# Patient Record
Sex: Male | Born: 2000 | Race: White | Hispanic: No | Marital: Single | Smoking: Never smoker
Health system: Southern US, Community
[De-identification: ages and names within clinical notes are randomized; demographics above are authoritative.]

## PROBLEM LIST (undated history)

## (undated) DIAGNOSIS — Z86718 Personal history of other venous thrombosis and embolism: Secondary | ICD-10-CM

## (undated) DIAGNOSIS — F909 Attention-deficit hyperactivity disorder, unspecified type: Secondary | ICD-10-CM

## (undated) DIAGNOSIS — F840 Autistic disorder: Secondary | ICD-10-CM

## (undated) HISTORY — DX: Personal history of other venous thrombosis and embolism: Z86.718

---
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
## 2022-01-05 IMAGING — CT CT HEAD W/O CM
3 series · 16 of 47 positions shown, 19 images · non-contrast
Comparison: None.

CLINICAL DATA: Laceration to right eyebrow head trauma

EXAM:
CT HEAD WITHOUT CONTRAST
TECHNIQUE: Contiguous axial images were obtained from the base of the skull
through the vertex without intravenous contrast.

[Series 2: head w o · axial · 0.43mm/px · z∈[-647,-517]mm · 10 of 32 slices shown, 13 images]
[im 3/32  brain]
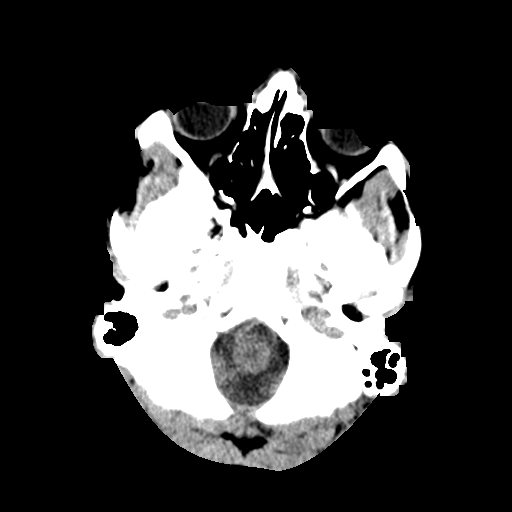
[im 3/32  bone]
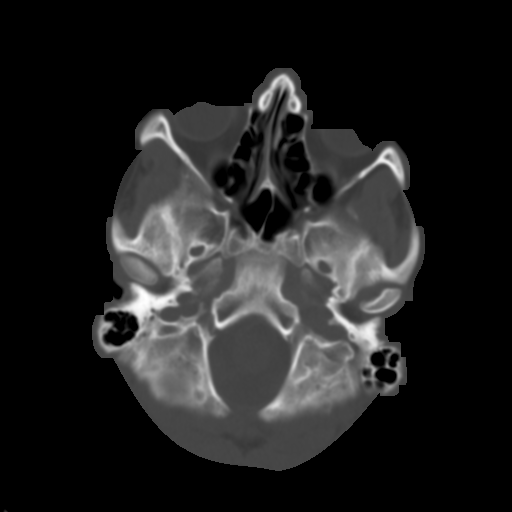
[im 6/32  brain]
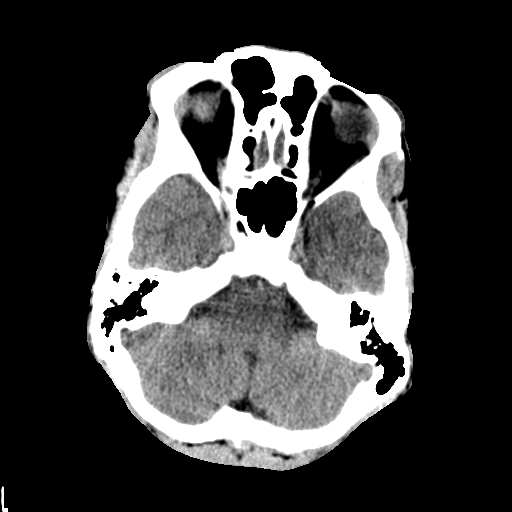
[im 9/32  brain]
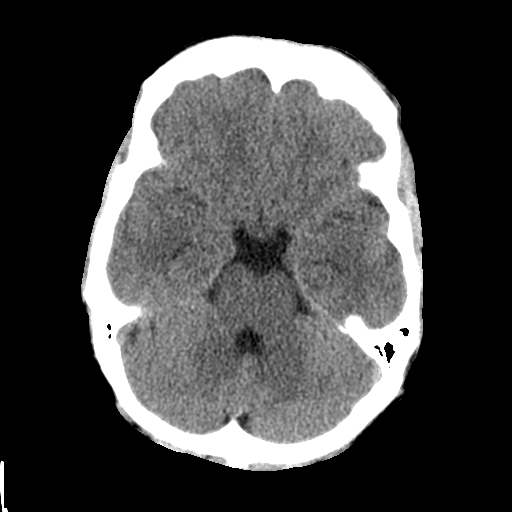
[im 11/32  brain]
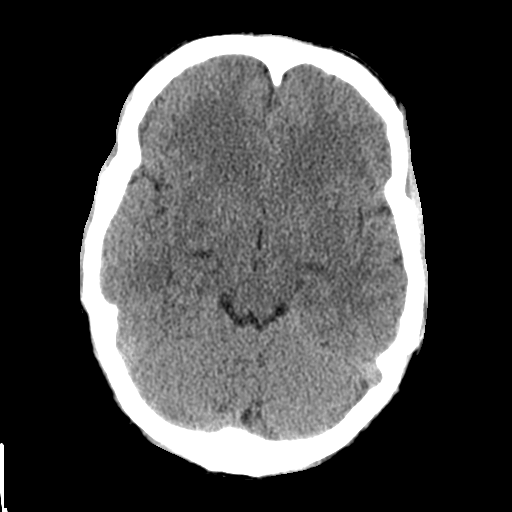
[im 14/32  brain]
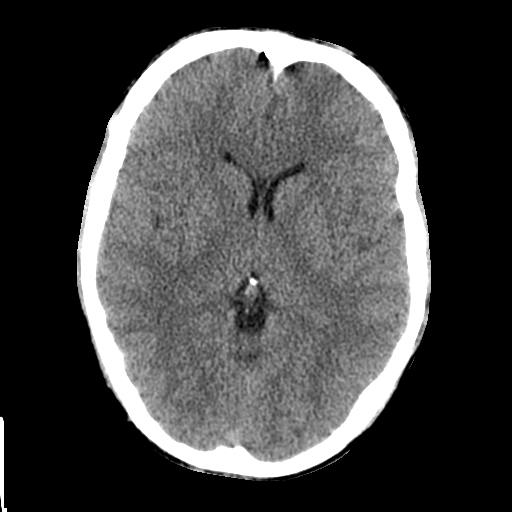
[im 14/32  bone]
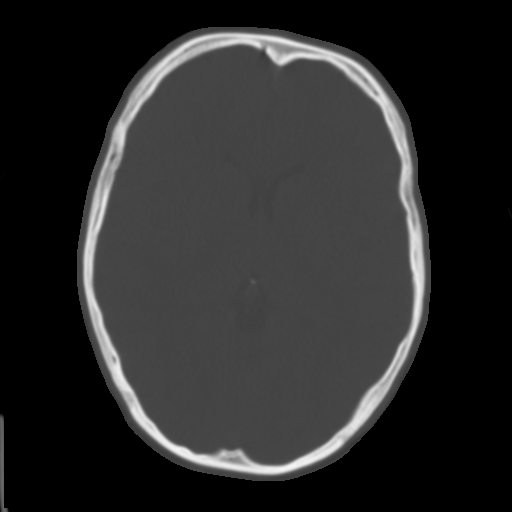
[im 18/32  brain]
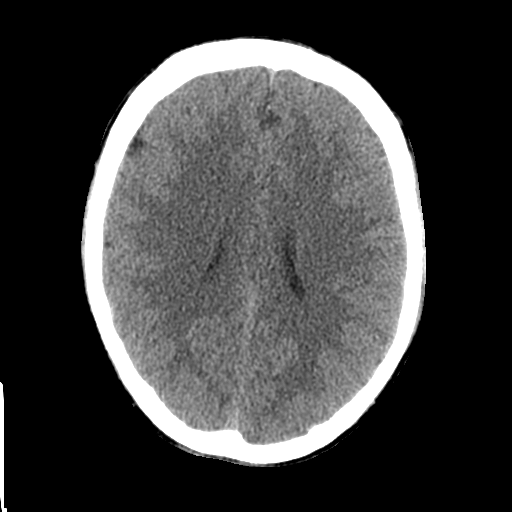
[im 21/32  brain]
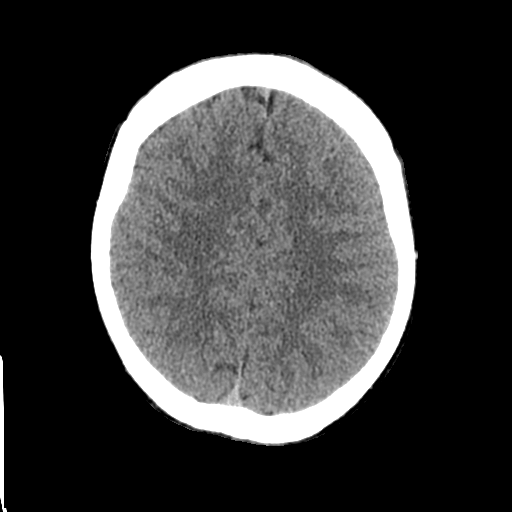
[im 24/32  brain]
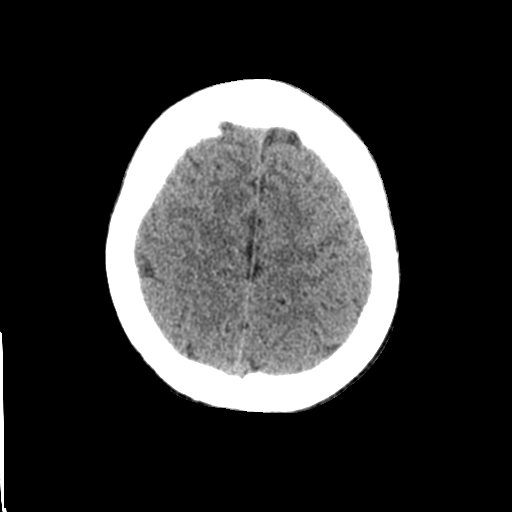
[im 26/32  brain]
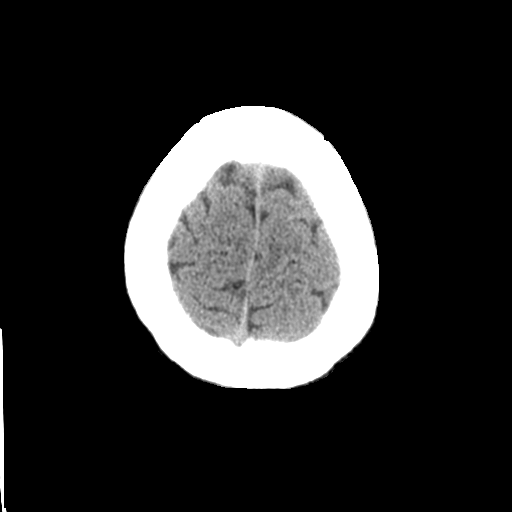
[im 26/32  bone]
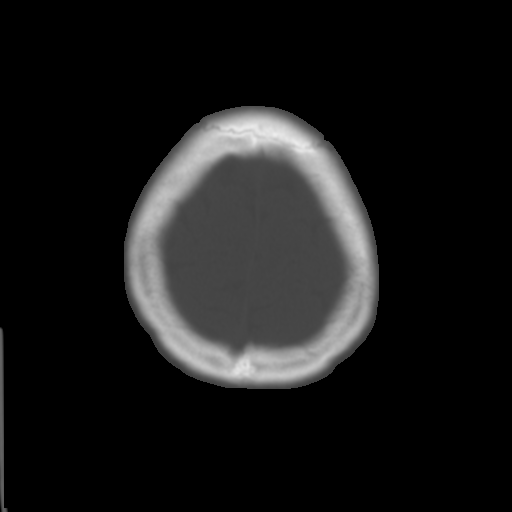
[im 29/32  brain]
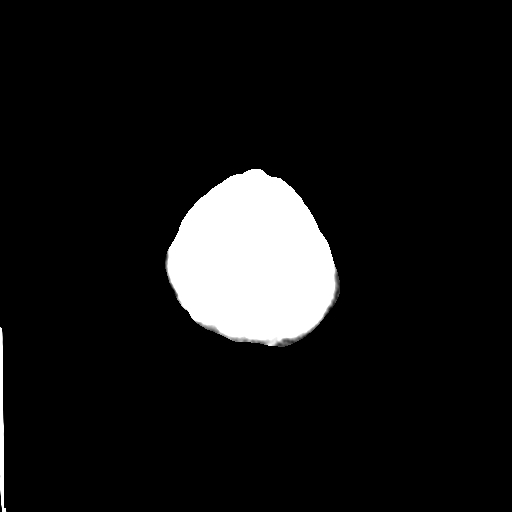

[Series 4: coronal soft · coronal · 0.32mm/px · 3 of 69 slices shown]
[im 23/69  brain]
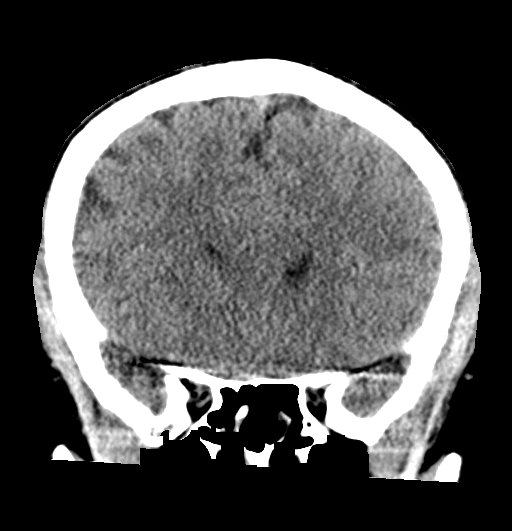
[im 31/69  brain]
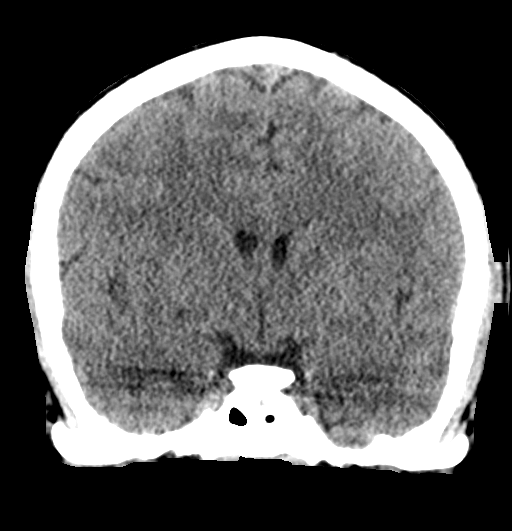
[im 38/69  brain]
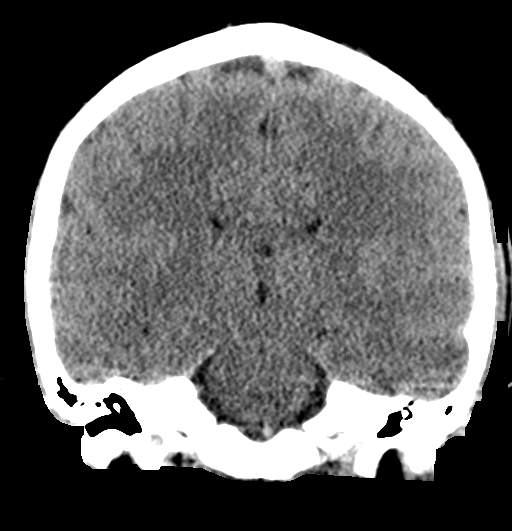

[Series 5: sagittal soft · sagittal · 0.32mm/px · 3 of 57 slices shown]
[im 19/57  brain]
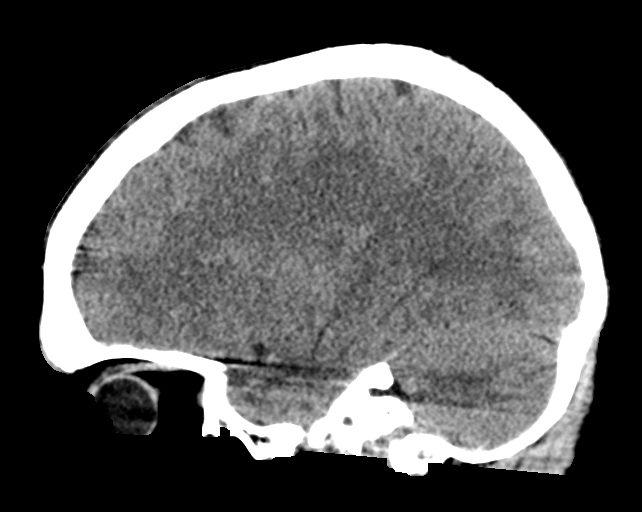
[im 29/57  brain]
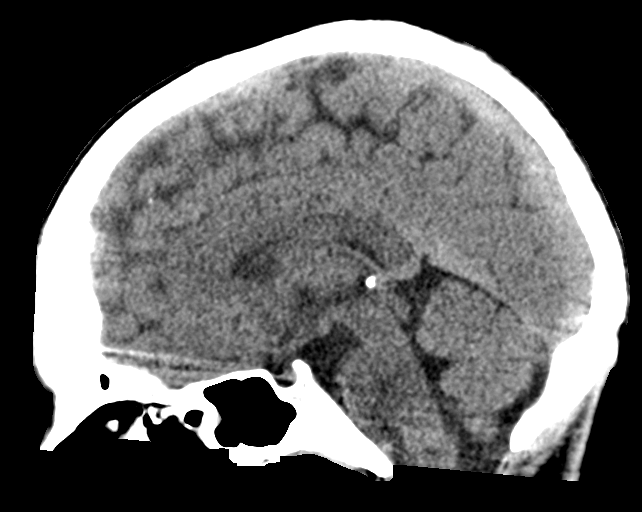
[im 38/57  brain]
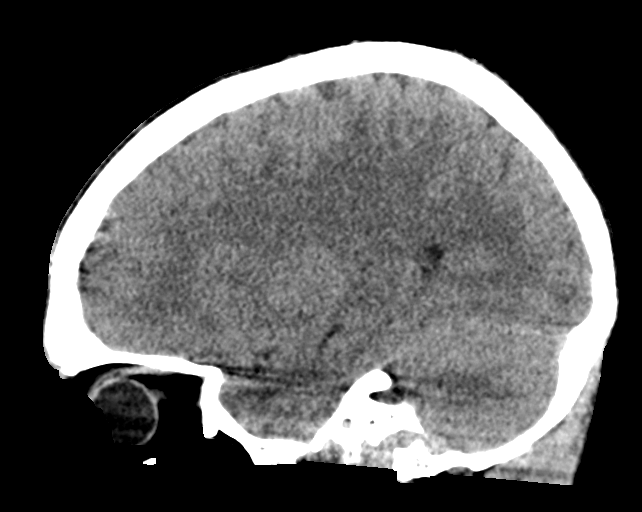

[16 of 47 positions shown; findings below may reference images not displayed]

FINDINGS: Brain: No evidence of acute infarction, hemorrhage, hydrocephalus,
extra-axial collection or mass lesion/mass effect.

Vascular: No hyperdense vessel or unexpected calcification.

Skull: Normal. Negative for fracture or focal lesion.

Sinuses/Orbits: No acute finding.

Other: None.
IMPRESSION: Negative non contrasted CT appearance of the brain.
